# Patient Record
Sex: Male | Born: 2000 | Race: Black or African American | Hispanic: No | Marital: Single | Smoking: Never smoker
Health system: Southern US, Community
[De-identification: ages and names within clinical notes are randomized; demographics above are authoritative.]

## PROBLEM LIST (undated history)

## (undated) DIAGNOSIS — J45909 Unspecified asthma, uncomplicated: Secondary | ICD-10-CM

## (undated) HISTORY — PX: CLEFT PALATE REPAIR: SUR1165

---
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
## 2020-03-05 LAB — HM HEPATITIS C SCREENING LAB: HM Hepatitis Screen: NEGATIVE

## 2020-03-05 LAB — HIV ANTIBODY (ROUTINE TESTING W REFLEX): HIV 1&2 Ab, 4th Generation: NONREACTIVE

## 2020-06-13 ENCOUNTER — Encounter (HOSPITAL_COMMUNITY): Payer: Self-pay

## 2020-06-13 ENCOUNTER — Emergency Department (HOSPITAL_COMMUNITY): Payer: No Typology Code available for payment source

## 2020-06-13 ENCOUNTER — Emergency Department (HOSPITAL_COMMUNITY)
Admission: EM | Admit: 2020-06-13 | Discharge: 2020-06-13 | Disposition: A | Discharge status: Home / Self Care | Payer: No Typology Code available for payment source | Attending: Emergency Medicine | Admitting: Emergency Medicine

## 2020-06-13 ENCOUNTER — Other Ambulatory Visit: Payer: Self-pay

## 2020-06-13 DIAGNOSIS — Y92410 Unspecified street and highway as the place of occurrence of the external cause: Secondary | ICD-10-CM | POA: Insufficient documentation

## 2020-06-13 DIAGNOSIS — M25562 Pain in left knee: Secondary | ICD-10-CM | POA: Insufficient documentation

## 2020-06-13 DIAGNOSIS — R079 Chest pain, unspecified: Secondary | ICD-10-CM | POA: Insufficient documentation

## 2020-06-13 DIAGNOSIS — M25511 Pain in right shoulder: Secondary | ICD-10-CM | POA: Diagnosis not present

## 2020-06-13 DIAGNOSIS — M25521 Pain in right elbow: Secondary | ICD-10-CM | POA: Insufficient documentation

## 2020-06-13 MED ORDER — CYCLOBENZAPRINE HCL 10 MG PO TABS: 10.0000 mg | ORAL_TABLET | Freq: Two times a day (BID) | ORAL | 0 refills | Status: AC | PRN

## 2020-06-13 MED ORDER — CYCLOBENZAPRINE HCL 10 MG PO TABS
5.0000 mg | ORAL_TABLET | Freq: Once | ORAL | Status: AC
  Administered 2020-06-13: 5 mg via ORAL
  Filled 2020-06-13: qty 1

## 2020-06-13 MED ORDER — ACETAMINOPHEN 325 MG PO TABS
650.0000 mg | ORAL_TABLET | Freq: Once | ORAL | Status: AC
  Administered 2020-06-13: 650 mg via ORAL
  Filled 2020-06-13: qty 2
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
## 2020-07-28 DIAGNOSIS — Z91199 Patient's noncompliance with other medical treatment and regimen due to unspecified reason: Secondary | ICD-10-CM | POA: Insufficient documentation

## 2020-07-28 DIAGNOSIS — Z113 Encounter for screening for infections with a predominantly sexual mode of transmission: Secondary | ICD-10-CM | POA: Insufficient documentation

## 2020-07-28 DIAGNOSIS — R369 Urethral discharge, unspecified: Secondary | ICD-10-CM | POA: Insufficient documentation

## 2020-07-28 DIAGNOSIS — R300 Dysuria: Secondary | ICD-10-CM | POA: Insufficient documentation

## 2020-07-28 HISTORY — DX: Patient's noncompliance with other medical treatment and regimen due to unspecified reason: Z91.199

## 2021-10-05 ENCOUNTER — Emergency Department (HOSPITAL_COMMUNITY)
Admission: EM | Admit: 2021-10-05 | Discharge: 2021-10-05 | Disposition: A | Discharge status: Home / Self Care | Payer: Medicaid Other | Attending: Emergency Medicine | Admitting: Emergency Medicine

## 2021-10-05 ENCOUNTER — Emergency Department (HOSPITAL_COMMUNITY): Payer: Medicaid Other

## 2021-10-05 ENCOUNTER — Encounter (HOSPITAL_COMMUNITY): Payer: Self-pay

## 2021-10-05 DIAGNOSIS — S43402A Unspecified sprain of left shoulder joint, initial encounter: Secondary | ICD-10-CM | POA: Insufficient documentation

## 2021-10-05 DIAGNOSIS — S4992XA Unspecified injury of left shoulder and upper arm, initial encounter: Secondary | ICD-10-CM | POA: Diagnosis present

## 2021-10-05 DIAGNOSIS — W19XXXA Unspecified fall, initial encounter: Secondary | ICD-10-CM | POA: Diagnosis not present

## 2021-10-05 DIAGNOSIS — Y93K1 Activity, walking an animal: Secondary | ICD-10-CM | POA: Diagnosis not present

## 2021-10-05 HISTORY — DX: Unspecified asthma, uncomplicated: J45.909

## 2021-10-05 NOTE — ED Provider Notes (Signed)
[facility]-EMERGENCY DEPT Provider Note   CSN: [id number] Arrival date & time: 10/05/21  1905     History  Chief Complaint  Patient presents with   Shoulder Injury    Michael Villanueva is a 21 y.o. male.  Patient here with left shoulder pain after getting tangled up with his dog while running and landed on his left shoulder.  Denies any weakness or numbness.  Did not hit his head or lose consciousness.  He is not on blood thinners.  No significant medical history.  Movement makes it worse.  Rest makes it better.  He took ibuprofen at home as well that helped.  The history is provided by the patient.       Home Medications Prior to Admission medications   Medication Sig Start Date End Date Taking? Authorizing Provider  cyclobenzaprine (FLEXERIL) 10 MG tablet Take 1 tablet (10 mg total) by mouth 2 (two) times daily as needed for muscle spasms. 06/13/20   Shanon Ace, PA-C      Allergies    Patient has no known allergies.    Review of Systems   Review of Systems  Physical Exam Updated Vital Signs  ED Triage Vitals  Enc Vitals Group     BP 10/05/21 1927 133/75     Pulse Rate 10/05/21 1927 65     Resp 10/05/21 1927 16     Temp 10/05/21 1927 98.8 F (37.1 C)     Temp Source 10/05/21 1927 Oral     SpO2 10/05/21 1927 100 %     Weight 10/05/21 1928 212 lb (96.2 kg)     Height 10/05/21 1928 5\' 8"  (1.727 m)     Head Circumference --      Peak Flow --      Pain Score 10/05/21 2028 8     Pain Loc --      Pain Edu? --      Excl. in [place]? --     Physical Exam Constitutional:      General: He is not in acute distress.    Appearance: He is not ill-appearing.  Cardiovascular:     Rate and Rhythm: Normal rate.     Pulses: Normal pulses.     Heart sounds: Normal heart sounds.  Musculoskeletal:        General: Tenderness present. No swelling. Normal range of motion.     Comments: Tenderness in the left shoulder but there is no obvious  deformity, good range of motion but with discomfort  Skin:    General: Skin is warm.     Capillary Refill: Capillary refill takes less than 2 seconds.  Neurological:     General: No focal deficit present.     Mental Status: He is alert.     Sensory: No sensory deficit.     Motor: No weakness.     ED Results / Procedures / Treatments   Labs (all labs ordered are listed, but only abnormal results are displayed) Labs Reviewed - No data to display  EKG None  Radiology DG Shoulder Left  Result Date: 10/05/2021 CLINICAL DATA:  Trauma to the left shoulder. EXAM: LEFT SHOULDER - 2+ VIEW COMPARISON:  None Available. FINDINGS: There is no evidence of fracture or dislocation. There is no evidence of arthropathy or other focal bone abnormality. Soft tissues are unremarkable. IMPRESSION: Negative. Electronically Signed   By: 10/07/2021 M.D.   On: 10/05/2021 21:25    Procedures Procedures  Medications Ordered in ED Medications - No data to display  ED Course/ Medical Decision Making/ A&P                           Medical Decision Making  Michael Villanueva is here with left shoulder pain after fall.  Normal vitals.  No fever.  Neurovascular neuromuscular intact on exam.  He is tender over the left shoulder area.  Differential diagnosis is fracture versus less likely dislocation versus likely sprain.  X-ray was obtained per my review and interpretation shows no fracture or dislocation.  Overall suspect sprain.  Possibly contusion.  Recommend ice, Tylenol, ibuprofen.  Placed in a splint.  We will follow-up with orthopedics.  Discharged in good condition.  This chart was dictated using voice recognition software.  Despite best efforts to proofread,  errors can occur which can change the documentation meaning.         Final Clinical Impression(s) / ED Diagnoses Final diagnoses:  Sprain of left shoulder, unspecified shoulder sprain type, initial encounter    Rx / DC Orders ED  Discharge Orders     None         Virgina Norfolk, DO 10/05/21 2314

## 2021-10-05 NOTE — Discharge Instructions (Signed)
Recommend 1000 mg of Tylenol every 6 hours as needed for pain.  Recommend 800 mg ibuprofen every 8 hours as needed for pain.  Recommend ice.  Wear sling for comfort.  Follow-up with orthopedics.

## 2021-10-05 NOTE — ED Triage Notes (Signed)
Pt was running with his dog, got tangled, and fell onto left shoulder.Pt has 8/10 pain when trying to move left arm

## 2021-10-28 ENCOUNTER — Other Ambulatory Visit: Payer: Self-pay | Admitting: Orthopedic Surgery

## 2021-10-28 DIAGNOSIS — M25512 Pain in left shoulder: Secondary | ICD-10-CM | POA: Insufficient documentation

## 2021-10-29 ENCOUNTER — Other Ambulatory Visit: Payer: Self-pay | Admitting: Orthopedic Surgery

## 2021-10-29 ENCOUNTER — Ambulatory Visit
Admission: RE | Admit: 2021-10-29 | Discharge: 2021-10-29 | Disposition: A | Discharge status: Home / Self Care | Payer: Medicaid Other | Source: Ambulatory Visit | Attending: Orthopedic Surgery | Admitting: Orthopedic Surgery

## 2021-10-29 DIAGNOSIS — M25512 Pain in left shoulder: Secondary | ICD-10-CM

## 2022-05-20 ENCOUNTER — Ambulatory Visit
Admission: RE | Admit: 2022-05-20 | Discharge: 2022-05-20 | Disposition: A | Discharge status: Home / Self Care | Payer: Self-pay | Source: Ambulatory Visit | Attending: Internal Medicine | Admitting: Internal Medicine

## 2022-05-20 VITALS — BP 137/86 | HR 81 | Temp 97.8°F | Resp 18

## 2022-05-20 DIAGNOSIS — Z113 Encounter for screening for infections with a predominantly sexual mode of transmission: Secondary | ICD-10-CM

## 2022-05-20 DIAGNOSIS — R369 Urethral discharge, unspecified: Secondary | ICD-10-CM

## 2022-05-20 DIAGNOSIS — R300 Dysuria: Secondary | ICD-10-CM

## 2022-05-20 LAB — POCT URINALYSIS DIP (MANUAL ENTRY)
Bilirubin, UA: NEGATIVE
Blood, UA: NEGATIVE
Glucose, UA: NEGATIVE mg/dL
Ketones, POC UA: NEGATIVE mg/dL
Leukocytes, UA: NEGATIVE
Nitrite, UA: NEGATIVE
Protein Ur, POC: NEGATIVE mg/dL
Spec Grav, UA: >=1.03 — AB (ref 1.010–1.025)
Urobilinogen, UA: 1 E.U./dL
pH, UA: 7 (ref 5.0–8.0)

## 2022-05-20 NOTE — ED Provider Notes (Signed)
[facility]    CSN: [id number] Arrival date & time: 05/20/22  1117      History   Chief Complaint Chief Complaint  Patient presents with   Penile Discharge    And also my ears have had a lot of wax and I believe puss but I'm not sure - Entered by patient   Urinary Retention    HPI Michael Villanueva is a 22 y.o. male.   Patient presents with dysuria and yellow penile discharge that started about 2 days ago.  He denies any exposure to STD but does report that he had unprotected sexual intercourse with 1 male sexual partner about 3 days ago.  Denies testicular pain or swelling, abdominal pain, back pain, fever.   Penile Discharge    Past Medical History:  Diagnosis Date   Asthma     There are no problems to display for this patient.   Past Surgical History:  Procedure Laterality Date   CLEFT PALATE REPAIR         Home Medications    Prior to Admission medications   Medication Sig Start Date End Date Taking? Authorizing Provider  cyclobenzaprine (FLEXERIL) 10 MG tablet Take 1 tablet (10 mg total) by mouth 2 (two) times daily as needed for muscle spasms. 06/13/20   Barrie Folk, PA-C    Family History Family History  Family history unknown: Yes    Social History Social History   Tobacco Use   Smoking status: Never   Smokeless tobacco: Never     Allergies   Patient has no known allergies.   Review of Systems Review of Systems Per HPI  Physical Exam Triage Vital Signs ED Triage Vitals  Enc Vitals Group     BP 05/20/22 1132 137/86     Pulse Rate 05/20/22 1132 81     Resp 05/20/22 1132 18     Temp 05/20/22 1132 97.8 F (36.6 C)     Temp Source 05/20/22 1132 Oral     SpO2 05/20/22 1132 97 %     Weight --      Height --      Head Circumference --      Peak Flow --      Pain Score 05/20/22 1133 1     Pain Loc --      Pain Edu? --      Excl. in [place]? --    No data found.  Updated Vital Signs BP 137/86 (BP Location:  Left Arm)   Pulse 81   Temp 97.8 F (36.6 C) (Oral)   Resp 18   SpO2 97%   Visual Acuity Right Eye Distance:   Left Eye Distance:   Bilateral Distance:    Right Eye Near:   Left Eye Near:    Bilateral Near:     Physical Exam Constitutional:      General: He is not in acute distress.    Appearance: Normal appearance. He is not toxic-appearing or diaphoretic.  HENT:     Head: Normocephalic and atraumatic.  Eyes:     Extraocular Movements: Extraocular movements intact.     Conjunctiva/sclera: Conjunctivae normal.  Pulmonary:     Effort: Pulmonary effort is normal.  Genitourinary:    Comments: Deferred with shared decision making. Self swab performed.  Neurological:     General: No focal deficit present.     Mental Status: He is alert and oriented to person, place, and time. Mental status is at baseline.  Psychiatric:  Mood and Affect: Mood normal.        Behavior: Behavior normal.        Thought Content: Thought content normal.        Judgment: Judgment normal.      UC Treatments / Results  Labs (all labs ordered are listed, but only abnormal results are displayed) Labs Reviewed  POCT URINALYSIS DIP (MANUAL ENTRY) - Abnormal; Notable for the following components:      Result Value   Spec Grav, UA >=1.030 (*)    All other components within normal limits  CYTOLOGY, (ORAL, ANAL, URETHRAL) ANCILLARY ONLY    EKG   Radiology No results found.  Procedures Procedures (including critical care time)  Medications Ordered in UC Medications - No data to display  Initial Impression / Assessment and Plan / UC Course  I have reviewed the triage vital signs and the nursing notes.  Pertinent labs & imaging results that were available during my care of the patient were reviewed by me and considered in my medical decision making (see chart for details).     UA completed given dysuria which was unremarkable.  Cytology swab pending given concern for STD.  Awaiting  result for treatment given no confirmed exposure.  Advised to refrain from sexual activity until test results and treatment are complete.  Patient verbalized understanding and was agreeable with plan. Final Clinical Impressions(s) / UC Diagnoses   Final diagnoses:  Penile discharge  Dysuria  Screening examination for venereal disease     Discharge Instructions      Urine was clear.  STD test is pending.  Will call if it is abnormal.      ED Prescriptions   None    PDMP not reviewed this encounter.   Teodora Medici, [place] 05/20/22 [phone]

## 2022-05-20 NOTE — Discharge Instructions (Signed)
Urine was clear.  STD test is pending.  Will call if it is abnormal.

## 2022-05-20 NOTE — ED Triage Notes (Signed)
Pt presents with abnormal discharge and urinary urgency X 2 days.

## 2022-05-21 LAB — CYTOLOGY, (ORAL, ANAL, URETHRAL) ANCILLARY ONLY
Chlamydia: POSITIVE — AB
Comment: NEGATIVE
Comment: NEGATIVE
Comment: NORMAL
Neisseria Gonorrhea: NEGATIVE
Trichomonas: NEGATIVE

## 2022-05-24 ENCOUNTER — Telehealth (HOSPITAL_COMMUNITY): Payer: Self-pay | Admitting: Emergency Medicine

## 2022-05-24 MED ORDER — DOXYCYCLINE HYCLATE 100 MG PO CAPS: 100.0000 mg | ORAL_CAPSULE | Freq: Two times a day (BID) | ORAL | 0 refills | Status: AC

## 2022-06-16 ENCOUNTER — Ambulatory Visit
Admission: RE | Admit: 2022-06-16 | Discharge: 2022-06-16 | Disposition: A | Discharge status: Home / Self Care | Payer: Self-pay | Source: Ambulatory Visit | Attending: Emergency Medicine | Admitting: Emergency Medicine

## 2022-06-16 VITALS — BP 135/83 | HR 93 | Temp 98.6°F | Resp 16

## 2022-06-16 DIAGNOSIS — H60393 Other infective otitis externa, bilateral: Secondary | ICD-10-CM

## 2022-06-16 MED ORDER — CIPROFLOXACIN-DEXAMETHASONE 0.3-0.1 % OT SUSP: 4.0000 [drp] | Freq: Two times a day (BID) | OTIC | 0 refills | Status: DC

## 2022-06-16 NOTE — ED Provider Notes (Signed)
[facility]    CSN: [id number] Arrival date & time: 06/16/22  1351      History   Chief Complaint Chief Complaint  Patient presents with   Otalgia    HPI Michael Villanueva is a 22 y.o. male.   Patient on bilateral ear pain for 7 days.  Symptoms occurring intermittently.  Denies pruritus, decreased hearing or fullness, fevers or URI symptoms.  Has attempted use of over-the-counter eardrops and decongestants which have been ineffective.  Endorses recently being at a water park but  denies head getting wet.    Past Medical History:  Diagnosis Date   Asthma     There are no problems to display for this patient.   Past Surgical History:  Procedure Laterality Date   CLEFT PALATE REPAIR         Home Medications    Prior to Admission medications   Medication Sig Start Date End Date Taking? Authorizing Provider  cyclobenzaprine (FLEXERIL) 10 MG tablet Take 1 tablet (10 mg total) by mouth 2 (two) times daily as needed for muscle spasms. 06/13/20   Shanon Ace, PA-C    Family History Family History  Family history unknown: Yes    Social History Social History   Tobacco Use   Smoking status: Never   Smokeless tobacco: Never     Allergies   Patient has no known allergies.   Review of Systems Review of Systems  Constitutional: Negative.   HENT:  Positive for ear pain. Negative for congestion, dental problem, drooling, ear discharge, facial swelling, hearing loss, mouth sores, nosebleeds, postnasal drip, rhinorrhea, sinus pressure, sinus pain, sneezing, sore throat, tinnitus, trouble swallowing and voice change.   Respiratory: Negative.    Cardiovascular: Negative.      Physical Exam Triage Vital Signs ED Triage Vitals [06/16/22 1422]  Enc Vitals Group     BP 135/83     Pulse Rate 93     Resp 16     Temp 98.6 F (37 C)     Temp Source Oral     SpO2 97 %     Weight      Height      Head Circumference      Peak Flow      Pain  Score 6     Pain Loc      Pain Edu?      Excl. in GC?    No data found.  Updated Vital Signs BP 135/83 (BP Location: Left Arm)   Pulse 93   Temp 98.6 F (37 C) (Oral)   Resp 16   SpO2 97%   Visual Acuity Right Eye Distance:   Left Eye Distance:   Bilateral Distance:    Right Eye Near:   Left Eye Near:    Bilateral Near:     Physical Exam Constitutional:      Appearance: Normal appearance.  HENT:     Head:     Comments: Erythema is present to the bilateral ear canals without drainage or swelling, no abnormalities to the tympanic membranes or external ear Eyes:     Extraocular Movements: Extraocular movements intact.  Pulmonary:     Effort: Pulmonary effort is normal.  Neurological:     Mental Status: He is alert and oriented to person, place, and time. Mental status is at baseline.      UC Treatments / Results  Labs (all labs ordered are listed, but only abnormal results are displayed) Labs Reviewed - No  data to display  EKG   Radiology No results found.  Procedures Procedures (including critical care time)  Medications Ordered in UC Medications - No data to display  Initial Impression / Assessment and Plan / UC Course  I have reviewed the triage vital signs and the nursing notes.  Pertinent labs & imaging results that were available during my care of the patient were reviewed by me and considered in my medical decision making (see chart for details).  Infective otitis externa of both ears  Presentation is consistent with infection to the ear canal, discussed with patient, Ciprodex prescribed and discussed administration, may use over-the-counter analgesics and warm compresses for pain control, advised against ear cleaning Final Clinical Impressions(s) / UC Diagnoses   Final diagnoses:  None   Discharge Instructions   None    ED Prescriptions   None    PDMP not reviewed this encounter.   Valinda Hoar, NP 06/16/22 [phone]

## 2022-06-16 NOTE — Discharge Instructions (Addendum)
Today you are being treated for an infection of the ear canal   Place 4 drops of Ciprodex which is a mixture of antibiotic and steroid into both ears every morning and every evening for 7 days  You may use Tylenol or ibuprofen for management of discomfort  May hold warm compresses to the ear for additional comfort  Please not attempted any ear cleaning or object or fluid placement into the ear canal to prevent further irritation

## 2022-06-16 NOTE — ED Triage Notes (Signed)
Pt c/o bilat otalgia onset "sometime last week."

## 2022-06-17 ENCOUNTER — Ambulatory Visit: Payer: Self-pay

## 2022-08-26 ENCOUNTER — Ambulatory Visit: Payer: Medicaid Other | Admitting: Critical Care Medicine

## 2022-08-26 NOTE — Progress Notes (Deleted)
   New Patient Office Visit  Subjective    Patient ID: Michael Villanueva, male    DOB: [birth year]  Age: 22 y.o. MRN: [id number]  CC: No chief complaint on file.   HPI Michael Villanueva presents to establish care ***  Outpatient Encounter Medications as of 08/26/2022  Medication Sig   ciprofloxacin-dexamethasone (CIPRODEX) OTIC suspension Place 4 drops into both ears 2 (two) times daily.   cyclobenzaprine (FLEXERIL) 10 MG tablet Take 1 tablet (10 mg total) by mouth 2 (two) times daily as needed for muscle spasms.   No facility-administered encounter medications on file as of 08/26/2022.    Past Medical History:  Diagnosis Date   Asthma     Past Surgical History:  Procedure Laterality Date   CLEFT PALATE REPAIR      Family History  Family history unknown: Yes    Social History   Socioeconomic History   Marital status: Single    Spouse name: Not on file   Number of children: Not on file   Years of education: Not on file   Highest education level: Not on file  Occupational History   Not on file  Tobacco Use   Smoking status: Never   Smokeless tobacco: Never  Substance and Sexual Activity   Alcohol use: Not on file   Drug use: Not on file   Sexual activity: Not on file  Other Topics Concern   Not on file  Social History Narrative   Not on file   Social Determinants of Health   Financial Resource Strain: Not on file  Food Insecurity: Not on file  Transportation Needs: Not on file  Physical Activity: Not on file  Stress: Not on file  Social Connections: Not on file  Intimate Partner Violence: Not on file    ROS      Objective    There were no vitals taken for this visit.  Physical Exam  {Labs (Optional):23779}    Assessment & Plan:   Problem List Items Addressed This Visit   None   No follow-ups on file.   Shan Levans, MD
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
## 2023-03-24 ENCOUNTER — Ambulatory Visit
Admission: EM | Admit: 2023-03-24 | Discharge: 2023-03-24 | Disposition: A | Discharge status: Home / Self Care | Payer: Self-pay | Attending: Physician Assistant | Admitting: Physician Assistant

## 2023-03-24 ENCOUNTER — Ambulatory Visit: Payer: Self-pay

## 2023-03-24 ENCOUNTER — Emergency Department (HOSPITAL_BASED_OUTPATIENT_CLINIC_OR_DEPARTMENT_OTHER)
Admission: EM | Admit: 2023-03-24 | Discharge: 2023-03-24 | Disposition: A | Discharge status: Home / Self Care | Payer: Self-pay | Attending: Emergency Medicine | Admitting: Emergency Medicine

## 2023-03-24 ENCOUNTER — Encounter (HOSPITAL_BASED_OUTPATIENT_CLINIC_OR_DEPARTMENT_OTHER): Payer: Self-pay | Admitting: Radiology

## 2023-03-24 ENCOUNTER — Other Ambulatory Visit: Payer: Self-pay

## 2023-03-24 DIAGNOSIS — R8290 Unspecified abnormal findings in urine: Secondary | ICD-10-CM | POA: Insufficient documentation

## 2023-03-24 DIAGNOSIS — R300 Dysuria: Secondary | ICD-10-CM | POA: Insufficient documentation

## 2023-03-24 DIAGNOSIS — J029 Acute pharyngitis, unspecified: Secondary | ICD-10-CM | POA: Insufficient documentation

## 2023-03-24 DIAGNOSIS — Q549 Hypospadias, unspecified: Secondary | ICD-10-CM | POA: Insufficient documentation

## 2023-03-24 DIAGNOSIS — R899 Unspecified abnormal finding in specimens from other organs, systems and tissues: Secondary | ICD-10-CM | POA: Insufficient documentation

## 2023-03-24 LAB — BASIC METABOLIC PANEL
Anion gap: 7 (ref 5–15)
BUN: 11 mg/dL (ref 6–20)
CO2: 25 mmol/L (ref 22–32)
Calcium: 8.8 mg/dL — ABNORMAL LOW (ref 8.9–10.3)
Chloride: 104 mmol/L (ref 98–111)
Creatinine, Ser: 0.95 mg/dL (ref 0.61–1.24)
GFR, Estimated: >60 mL/min (ref >60)
Glucose, Bld: 104 mg/dL — ABNORMAL HIGH (ref 70–99)
Potassium: 4.1 mmol/L (ref 3.5–5.1)
Sodium: 136 mmol/L (ref 135–145)

## 2023-03-24 LAB — POCT URINALYSIS DIP (MANUAL ENTRY)
Blood, UA: NEGATIVE
Glucose, UA: NEGATIVE mg/dL
Ketones, POC UA: NEGATIVE mg/dL
Leukocytes, UA: NEGATIVE
Nitrite, UA: NEGATIVE
Protein Ur, POC: 100 mg/dL — AB
Spec Grav, UA: >=1.03 — AB (ref 1.010–1.025)
Urobilinogen, UA: >=8 U/dL — AB
pH, UA: 6.5 (ref 5.0–8.0)

## 2023-03-24 LAB — CBC WITH DIFFERENTIAL/PLATELET
Abs Immature Granulocytes: 0.07 10*3/uL (ref 0.00–0.07)
Basophils Absolute: 0 10*3/uL (ref 0.0–0.1)
Basophils Relative: 0 %
Eosinophils Absolute: 0.1 10*3/uL (ref 0.0–0.5)
Eosinophils Relative: 1 %
HCT: 46.4 % (ref 39.0–52.0)
Hemoglobin: 15.3 g/dL (ref 13.0–17.0)
Immature Granulocytes: 1 %
Lymphocytes Relative: 30 %
Lymphs Abs: 2.6 10*3/uL (ref 0.7–4.0)
MCH: 26.8 pg (ref 26.0–34.0)
MCHC: 33 g/dL (ref 30.0–36.0)
MCV: 81.3 fL (ref 80.0–100.0)
Monocytes Absolute: 1 10*3/uL (ref 0.1–1.0)
Monocytes Relative: 11 %
Neutro Abs: 5 10*3/uL (ref 1.7–7.7)
Neutrophils Relative %: 57 %
Platelets: 257 10*3/uL (ref 150–400)
RBC: 5.71 MIL/uL (ref 4.22–5.81)
RDW: 12.7 % (ref 11.5–15.5)
WBC: 8.7 10*3/uL (ref 4.0–10.5)
nRBC: 0 % (ref 0.0–0.2)

## 2023-03-24 LAB — HEPATIC FUNCTION PANEL
ALT: 119 U/L — ABNORMAL HIGH (ref 0–44)
AST: 86 U/L — ABNORMAL HIGH (ref 15–41)
Albumin: 3.3 g/dL — ABNORMAL LOW (ref 3.5–5.0)
Alkaline Phosphatase: 79 U/L (ref 38–126)
Bilirubin, Direct: 0.1 mg/dL (ref 0.0–0.2)
Indirect Bilirubin: 0.4 mg/dL (ref 0.3–0.9)
Total Bilirubin: 0.5 mg/dL (ref 0.0–1.2)
Total Protein: 6.6 g/dL (ref 6.5–8.1)

## 2023-03-24 LAB — URINALYSIS, ROUTINE W REFLEX MICROSCOPIC
Glucose, UA: NEGATIVE mg/dL
Hgb urine dipstick: NEGATIVE
Ketones, ur: NEGATIVE mg/dL
Leukocytes,Ua: NEGATIVE
Nitrite: NEGATIVE
Protein, ur: 30 mg/dL — AB
Specific Gravity, Urine: 1.025 (ref 1.005–1.030)
pH: 6.5 (ref 5.0–8.0)

## 2023-03-24 LAB — URINALYSIS, MICROSCOPIC (REFLEX)
RBC / HPF: NONE SEEN RBC/hpf (ref 0–5)
WBC, UA: NONE SEEN WBC/hpf (ref 0–5)

## 2023-03-24 LAB — LIPASE, BLOOD: Lipase: 22 U/L (ref 11–51)

## 2023-03-24 LAB — MONONUCLEOSIS SCREEN: Mono Screen: NEGATIVE

## 2023-03-24 LAB — POCT RAPID STREP A (OFFICE): Rapid Strep A Screen: NEGATIVE

## 2023-03-24 NOTE — ED Notes (Signed)
 Patient is being discharged from the Urgent Care and sent to the Emergency Department via Private Vehicle (Self) . Per R. Billy RIGGERS, patient is in need of higher level of care due to Abnormal Urine. Patient is aware and verbalizes understanding of plan of care.  Vitals:   03/24/23 1450  BP: 134/82  Pulse: 88  Resp: 18  Temp: 97.9 F (36.6 C)  SpO2: 98%

## 2023-03-24 NOTE — ED Triage Notes (Signed)
 Pt states he developed a sore throat yesterday. States it feels like when he had strep throat. Pt also endorsing painful urination at times. PT also endorsing penis pain on and off since he had chlamydia last April. Pt was seen at a urgent care today and urinalysis done and resulted. Pt also with cytology sent off, unsure for what. Pt also tested for strep and it was negative. Pt was sent here due to having bilirubin in his urine.

## 2023-03-24 NOTE — Discharge Instructions (Addendum)
 You were seen in the ER today for evaluation of your abnormal labs as well as your sore throat and pain to your penis.  Your lab work does show you have a slightly elevated liver enzymes.  I would like for you to follow-up with your primary care doctor for further evaluation of this.  You do have a small and bilirubin in your urine however your urine appears to be greatly improved from the urine seen in urgent care.  Again, because of your urinary lab results as well as your liver functions, you will need to follow-up with her primary care doctor for further evaluation of this.  Additionally, I would like for you to discuss about your pain with urination as well.  I culture your urine to see if it grows out any signs of infection.  For your sore throat, recommend throat lozenges and keeping your throat well-hydrated lubricated with things such as popsicles, cold water, etc.  Your strep test was negative.  You can try Tylenol  ibuprofen as needed for pain.  If you have any concerns, new or worsening symptoms, please return to the nearest emergency department for reevaluation.  Please follow-up with your MyChart for monotest results.  If you do test positive for mono, no contact sports for the next 4 to 6 weeks.  Again, you will need to follow-up department care doctor for this.  Contact a health care provider if: You have a fever or chills. You have pain in your back or sides. You throw up or feel like you may throw up. You have blood in your pee. You're not peeing as often as normal. You feel very weak. Get help right away if: You have very bad pain that doesn't get better with medicine. You're confused. You have a fast heartbeat while resting.

## 2023-03-24 NOTE — ED Provider Notes (Signed)
 [facility] Provider Note   CSN: [id number] Arrival date & time: 03/24/23  1711     History Chief Complaint  Patient presents with   Sore Throat    Michael Villanueva is a 23 y.o. male self-reported healthy presents to the emergency department today for evaluation of bilirubin in his urine.  Patient was seen in urgent care for sore throat and pain to the tip of his penis off and on since April.  When doing a urinalysis, there was significant amount of urine bilirubin, bilirubin, and protein in the urine and was sent over here for further evaluation.  He did have a strep test that was negative.  Has not had any fevers or trouble swallowing.  No runny nose or nasal congestion.  Denies any chest pain or trouble breathing.  He reports that he was recently treated for chlamydia in April and has been having pain in his penis intermittently since then.  Will occasionally have some dysuria but no hematuria.  Denies any urgency or frequency.  Denies any discharge from the penis.  He has not had any sexual contact since being treated for STDs in April.  He reports that he adhere to medication and took to completion.  He does not take any supplements.  Reports rare alcohol use.  Denies any other tobacco or illicit drug use.  He denies any pain into his testicles or scrotum.  Denies any swelling to the area as well.  No swelling or skin changes to the penis.  He reports that occasionally he will also have some right upper quadrant abdominal pain off and on since April.  Not having any now.  Cannot relax, he had this pain.  Does not endorse any nausea or vomiting.  No bowel changes.   Sore Throat Pertinent negatives include no chest pain and no shortness of breath.       Home Medications Prior to Admission medications   Medication Sig Start Date End Date Taking? Authorizing Provider  albuterol (VENTOLIN HFA) 108 (90 Base) MCG/ACT inhaler Inhale 2 puffs into the  lungs every 4 (four) hours as needed for wheezing or shortness of breath. 04/18/19   [provider]  cefTRIAXone  (ROCEPHIN ) 500 MG injection Inject 500 mg into the muscle once. 07/29/20   [provider]  ciprofloxacin -dexamethasone  (CIPRODEX ) OTIC suspension Place 4 drops into both ears 2 (two) times daily. 06/16/22   White, Shelba SAUNDERS, NP  cyclobenzaprine  (FLEXERIL ) 10 MG tablet Take 1 tablet (10 mg total) by mouth 2 (two) times daily as needed for muscle spasms. 06/13/20   Walisiewicz, Kaitlyn E, PA-C  dexmethylphenidate (FOCALIN XR) 20 MG 24 hr capsule Take 20 mg by mouth daily. 05/19/19   [provider]  fluticasone (FLONASE) 50 MCG/ACT nasal spray Place 2 sprays into both nostrils daily. 04/18/19   [provider]  montelukast (SINGULAIR) 10 MG tablet Take 1 tablet by mouth at bedtime. 04/18/19   [provider]      Allergies    Patient has no known allergies.    Review of Systems   Review of Systems  Constitutional:  Negative for chills and fever.  Respiratory:  Negative for shortness of breath.   Cardiovascular:  Negative for chest pain.  SEE HPI  Physical Exam Updated Vital Signs BP 129/87 (BP Location: Left Arm)   Pulse 94   Temp 98.6 F (37 C)   Resp 20   Ht 5' 7 (1.702 m)  Wt 100.7 kg   SpO2 100%   BMI 34.77 kg/m  Physical Exam Vitals and nursing note reviewed. Exam conducted with a chaperone present Denson, RN).  Constitutional:      General: He is not in acute distress.    Appearance: He is not ill-appearing or toxic-appearing.  HENT:     Mouth/Throat:     Mouth: Mucous membranes are moist.     Comments: Mild pharyngeal erythema.  No exudate or edema noted.  Airway patent.  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  Controlling secretions.  No sublingual elevation.  No trismus.  Normal speech. Cardiovascular:     Rate and Rhythm: Normal rate.  Pulmonary:     Effort: Pulmonary effort is  normal.     Breath sounds: Normal breath sounds.  Abdominal:     Palpations: Abdomen is soft.     Tenderness: There is no abdominal tenderness. There is no rebound.  Genitourinary:    Penis: Hypospadias present.      Testes:        Right: Mass, tenderness or swelling not present.        Left: Mass, tenderness or swelling not present.     Epididymis:     Right: Normal.     Left: Normal.     Comments: Hypospadias present.  Patient reports chronic for him.  There is no other abnormality seen to the penis.  No discharge.  No rash or lesions.  No increase in erythema or warmth.  Testicles have normal lie.  No masses palpated.  No edema. Skin:    General: Skin is warm and dry.  Neurological:     Mental Status: He is alert.     ED Results / Procedures / Treatments   Labs (all labs ordered are listed, but only abnormal results are displayed) Labs Reviewed  BASIC METABOLIC PANEL - Abnormal; Notable for the following components:      Result Value   Glucose, Bld 104 (*)    Calcium 8.8 (*)    All other components within normal limits  CBC WITH DIFFERENTIAL/PLATELET  HEPATIC FUNCTION PANEL  LIPASE, BLOOD  URINALYSIS, ROUTINE W REFLEX MICROSCOPIC    EKG None  Radiology No results found.  Procedures Procedures   Medications Ordered in ED Medications - No data to display  ED Course/ Medical Decision Making/ A&P   Medical Decision Making Amount and/or Complexity of Data Reviewed Labs: ordered.   23 y.o. male presents to the ER for evaluation of abnormal labs. Differential diagnosis includes but is not limited to lab error, normal labs, mono, strep throat, viral pharyngitis. Vital signs unremarkable. Physical exam as noted above.   On previous chart evaluation, patient was in urgent care earlier today.  Had a strep test that was negative.  Urinalysis showed straw-colored urine that was cloudy.  There is a small amount of bilirubin present with concentrated specific gravity.   There was 100 protein present as well as greater than 8.0 urobilinogen present.  Because of his abnormal urine, he was sent over to the emergency department.  I independently reviewed and interpreted the patient's labs.  CBC without leukocytosis or anemia.  Lipase within normal limits.  BMP shows glucose at 104 and mildly decreased calcium 8.8 otherwise no other electrolyte abnormality.  Hepatic function panel shows mildly decreased albumin at 3.3.  Mildly elevated AST and ALT at 86 and 119 respectively.  Normal alk phos.  Normal total bilirubin.  Urinalysis shows small  amount of bilirubin with 30 protein present.  There is rare bacteria but no red blood cells, white blood cells, nitrites, leukocytes present.  No ketones.  No hemoglobin.  I have added on the monotest given the patient's sore throat as well as mildly elevated liver enzymes.  Patient did have negative strep test at urgent care that is present under labs.  He does not have any belly pain tenderness on palpation.  Soft.  He denies any recent fevers or sick contacts.  We have decided to retest urine think there was not any lab error.  Monotest is negative.  I discussed with my attending.  Recommends following with her primary care provider for this.  Does not see any additional need for imaging or consultation.  I discussed this with the patient about the need to follow-up with her primary care provider for abnormal urine findings to see if this clears.  Also needs to check and further evaluate the elevated liver enzymes.  He stable for discharge home with close outpatient follow-up and strict return precautions.  We discussed the results of the labs/imaging. The plan is follow-up with primary care provider. We discussed strict return precautions and red flag symptoms. The patient verbalized their understanding and agrees to the plan. The patient is stable and being discharged home in good condition.  Portions of this report may have been  transcribed using voice recognition software. Every effort was made to ensure accuracy; however, inadvertent computerized transcription errors may be present.   I discussed this case with my attending physician who cosigned this note including patient's presenting symptoms, physical exam, and planned diagnostics and interventions. Attending physician stated agreement with plan or made changes to plan which were implemented.   Final Clinical Impression(s) / ED Diagnoses Final diagnoses:  Abnormal laboratory test result  Dysuria  Sore throat    Rx / DC Orders ED Discharge Orders     None         Bernis Ernst, DEVONNA 03/26/23 2017    Darra Fonda MATSU, MD 03/30/23 1409

## 2023-03-24 NOTE — ED Provider Notes (Signed)
 Patient presented today with multiple complaints one of which was painful urination. On review of UA it appears patient has marked urobilinogen, protein and bilirubin. Recommended further evaluation in the ED for stat labs, further work up. Patient is agreeable and will transport via POV.    Billy Asberry FALCON, PA-C 03/24/23 [phone]

## 2023-03-24 NOTE — ED Triage Notes (Signed)
"  I am having a sore throat and I have been having discomfort since April after having STI, this shock of discomfort is continuing on/off". Some "painful urination a lot". No current PCP. No rash in private area.

## 2023-03-24 NOTE — ED Notes (Signed)
 Provider unable to access a computer creating unreasonable inconvenience to the ordering provider with possible delay in patient care. Acknowledged by [employer]. Repeated Verbal order by Brian-Provider.

## 2023-03-25 ENCOUNTER — Telehealth: Payer: Self-pay | Admitting: *Deleted

## 2023-03-25 LAB — URINE CULTURE: Culture: NO GROWTH

## 2023-03-25 LAB — CYTOLOGY, (ORAL, ANAL, URETHRAL) ANCILLARY ONLY
Chlamydia: NEGATIVE
Chlamydia: NEGATIVE
Comment: NEGATIVE
Comment: NEGATIVE
Comment: NEGATIVE
Comment: NORMAL
Comment: NORMAL
Neisseria Gonorrhea: NEGATIVE
Neisseria Gonorrhea: NEGATIVE
Trichomonas: NEGATIVE

## 2023-03-25 LAB — GC/CHLAMYDIA PROBE AMP (~~LOC~~) NOT AT ARMC
Chlamydia: NEGATIVE
Comment: NEGATIVE
Comment: NORMAL
Neisseria Gonorrhea: NEGATIVE

## 2023-03-25 NOTE — Telephone Encounter (Signed)
 Call received from Cytology lab stating that trichomonas unable to be run off the throat and penile swabs received yesterday.

## 2023-03-29 ENCOUNTER — Ambulatory Visit (INDEPENDENT_AMBULATORY_CARE_PROVIDER_SITE_OTHER): Payer: Self-pay | Admitting: Family Medicine

## 2023-03-29 DIAGNOSIS — Z91199 Patient's noncompliance with other medical treatment and regimen due to unspecified reason: Secondary | ICD-10-CM

## 2023-03-30 NOTE — Progress Notes (Signed)
No show

## 2023-06-28 ENCOUNTER — Encounter: Payer: Self-pay | Admitting: Emergency Medicine

## 2023-06-28 ENCOUNTER — Ambulatory Visit
Admission: RE | Admit: 2023-06-28 | Discharge: 2023-06-28 | Disposition: A | Discharge status: Home / Self Care | Payer: Self-pay | Source: Ambulatory Visit

## 2023-06-28 ENCOUNTER — Ambulatory Visit
Admission: RE | Admit: 2023-06-28 | Discharge: 2023-06-28 | Disposition: A | Discharge status: Home / Self Care | Payer: Self-pay | Source: Ambulatory Visit | Attending: Family Medicine | Admitting: Family Medicine

## 2023-06-28 DIAGNOSIS — R369 Urethral discharge, unspecified: Secondary | ICD-10-CM

## 2023-06-28 DIAGNOSIS — Z113 Encounter for screening for infections with a predominantly sexual mode of transmission: Secondary | ICD-10-CM

## 2023-06-28 MED ORDER — CEFTRIAXONE SODIUM 500 MG IJ SOLR
500.0000 mg | INTRAMUSCULAR | Status: DC
  Administered 2023-06-28: 500 mg via INTRAMUSCULAR

## 2023-06-28 NOTE — ED Triage Notes (Signed)
 Pt c/o penis discharge and burning when he urinates since yesterday.

## 2023-06-28 NOTE — Discharge Instructions (Signed)
 You were seen today for sore throat and penile discharge.   I have given you a shot to treat possible gonorrhea.  Your swabs and blood work will be resulted in the next several days, and you will be called if anything is positive for further treatment.  Do not have sex until all results are completed and treatment is rendered if needed.

## 2023-06-28 NOTE — ED Provider Notes (Signed)
 [facility]    CSN: [id number] Arrival date & time: 06/28/23  1047      History   Chief Complaint Chief Complaint  Patient presents with   Exposure to STD    HPI Michael Villanueva is a 23 y.o. male.    Exposure to STD   Patient is here for penile burning and discharge since yesterday.  No known exposures to STDs.  Yellow d/c.   He is also having throat irritation that started yesterday.  He did partake in oral sex, and worried about that as well.  He states this is the same sensation as to when he tested positive for gonorrhea and would like to be treated if possible.  He would like blood work also     Past Medical History:  Diagnosis Date   Asthma     Patient Active Problem List   Diagnosis Date Noted   Pain of left sternoclavicular joint 10/29/2021   Shoulder pain, left 10/28/2021   Dysuria 07/28/2020   Penile discharge 07/28/2020   No-show for appointment 07/28/2020    Past Surgical History:  Procedure Laterality Date   CLEFT PALATE REPAIR         Home Medications    Prior to Admission medications   Medication Sig Start Date End Date Taking? Authorizing Provider  albuterol (VENTOLIN HFA) 108 (90 Base) MCG/ACT inhaler Inhale 2 puffs into the lungs every 4 (four) hours as needed for wheezing or shortness of breath. 04/18/19   [provider]  cefTRIAXone (ROCEPHIN) 500 MG injection Inject 500 mg into the muscle once. 07/29/20   [provider]  ciprofloxacin -dexamethasone  (CIPRODEX ) OTIC suspension Place 4 drops into both ears 2 (two) times daily. 06/16/22   White, Maybelle Spatz, NP  cyclobenzaprine  (FLEXERIL ) 10 MG tablet Take 1 tablet (10 mg total) by mouth 2 (two) times daily as needed for muscle spasms. 06/13/20   Walisiewicz, Kaitlyn E, PA-C  dexmethylphenidate (FOCALIN XR) 20 MG 24 hr capsule Take 20 mg by mouth daily. 05/19/19   [provider]  fluticasone (FLONASE) 50 MCG/ACT nasal spray Place 2 sprays into both  nostrils daily. 04/18/19   [provider]  montelukast (SINGULAIR) 10 MG tablet Take 1 tablet by mouth at bedtime. 04/18/19   [provider]    Family History Family History  Family history unknown: Yes    Social History Social History   Tobacco Use   Smoking status: Never   Smokeless tobacco: Never  Vaping Use   Vaping status: Never Used  Substance Use Topics   Alcohol use: Yes    Comment: Occassionally.   Drug use: Not Currently     Allergies   Patient has no known allergies.   Review of Systems Review of Systems  Constitutional: Negative.   HENT:  Positive for sore throat.   Respiratory: Negative.    Cardiovascular: Negative.   Gastrointestinal: Negative.   Genitourinary:  Positive for penile discharge.     Physical Exam Triage Vital Signs ED Triage Vitals  Encounter Vitals Group     BP 06/28/23 1110 (!) 143/85     Systolic BP Percentile --      Diastolic BP Percentile --      Pulse Rate 06/28/23 1110 99     Resp 06/28/23 1110 16     Temp 06/28/23 1110 98.9 F (37.2 C)     Temp Source 06/28/23 1110 Oral     SpO2 06/28/23 1110 97 %     Weight  06/28/23 1109 222 lb 0.1 oz (100.7 kg)     Height --      Head Circumference --      Peak Flow --      Pain Score 06/28/23 1109 10     Pain Loc --      Pain Education --      Exclude from Growth Chart --    No data found.  Updated Vital Signs BP (!) 143/85 (BP Location: Left Arm)   Pulse 99   Temp 98.9 F (37.2 C) (Oral)   Resp 16   Wt 100.7 kg   SpO2 97%   BMI 34.77 kg/m   Visual Acuity Right Eye Distance:   Left Eye Distance:   Bilateral Distance:    Right Eye Near:   Left Eye Near:    Bilateral Near:     Physical Exam Constitutional:      Appearance: Normal appearance. He is normal weight.  HENT:     Mouth/Throat:     Mouth: Mucous membranes are moist.     Pharynx: No oropharyngeal exudate or posterior oropharyngeal erythema.  Cardiovascular:     Rate and Rhythm:  Normal rate and regular rhythm.  Pulmonary:     Effort: Pulmonary effort is normal.     Breath sounds: Normal breath sounds.  Neurological:     General: No focal deficit present.     Mental Status: He is alert.  Psychiatric:        Mood and Affect: Mood normal.      UC Treatments / Results  Labs (all labs ordered are listed, but only abnormal results are displayed) Labs Reviewed  RPR  HIV ANTIBODY (ROUTINE TESTING W REFLEX)  CYTOLOGY, (ORAL, ANAL, URETHRAL) ANCILLARY ONLY  CYTOLOGY, (ORAL, ANAL, URETHRAL) ANCILLARY ONLY    EKG   Radiology No results found.  Procedures Procedures (including critical care time)  Medications Ordered in UC Medications  cefTRIAXone (ROCEPHIN) injection 500 mg (has no administration in time range)    Initial Impression / Assessment and Plan / UC Course  I have reviewed the triage vital signs and the nursing notes.  Pertinent labs & imaging results that were available during my care of the patient were reviewed by me and considered in my medical decision making (see chart for details).   Final Clinical Impressions(s) / UC Diagnoses   Final diagnoses:  Screening for STD (sexually transmitted disease)  Penile discharge     Discharge Instructions      You were seen today for sore throat and penile discharge.   I have given you a shot to treat possible gonorrhea.  Your swabs and blood work will be resulted in the next several days, and you will be called if anything is positive for further treatment.  Do not have sex until all results are completed and treatment is rendered if needed.   ED Prescriptions   None    PDMP not reviewed this encounter.   Lesle Ras, MD 06/28/23 1128

## 2023-06-29 ENCOUNTER — Telehealth: Payer: Self-pay

## 2023-06-29 LAB — CYTOLOGY, (ORAL, ANAL, URETHRAL) ANCILLARY ONLY
Chlamydia: NEGATIVE
Comment: NEGATIVE
Comment: NORMAL
Neisseria Gonorrhea: NEGATIVE

## 2023-06-29 NOTE — Telephone Encounter (Signed)
 Copied from CRM [id number]. Topic: MyChart - Other >> Jun 28, 2023  3:32 PM Stanly Early wrote: Reason for CRM: patient is requesting a doctors note.

## 2023-06-30 LAB — CYTOLOGY, (ORAL, ANAL, URETHRAL) ANCILLARY ONLY
Chlamydia: NEGATIVE
Comment: NEGATIVE
Comment: NORMAL
Neisseria Gonorrhea: POSITIVE — AB

## 2023-07-01 ENCOUNTER — Ambulatory Visit (HOSPITAL_COMMUNITY): Payer: Self-pay

## 2023-07-01 LAB — RPR: RPR Ser Ql: NONREACTIVE

## 2023-07-01 LAB — HIV ANTIBODY (ROUTINE TESTING W REFLEX): HIV Screen 4th Generation wRfx: NONREACTIVE

## 2023-10-10 ENCOUNTER — Ambulatory Visit: Payer: Self-pay

## 2023-10-10 DIAGNOSIS — Z113 Encounter for screening for infections with a predominantly sexual mode of transmission: Secondary | ICD-10-CM

## 2023-10-10 DIAGNOSIS — N341 Nonspecific urethritis: Secondary | ICD-10-CM

## 2023-10-10 LAB — GRAM STAIN

## 2023-10-10 LAB — HM HIV SCREENING LAB: HM HIV Screening: NEGATIVE

## 2023-10-10 LAB — HM HEPATITIS C SCREENING LAB: HM Hepatitis Screen: NEGATIVE

## 2023-10-10 MED ORDER — DOXYCYCLINE HYCLATE 100 MG PO TABS: 100.0000 mg | ORAL_TABLET | Freq: Two times a day (BID) | ORAL | Status: AC

## 2023-10-10 NOTE — Progress Notes (Signed)
 Pt here for STI screening.  Gram stain results reviewed with patient.  Results positive for NGU.  Doxycycline  100mg . #14 dispensed to patient.  Pt counseled re medication, side effects, plan of care and when to contact clinic with questions or concerns.  Verbalizes understanding.  Contact card x1. Condoms declined.-Javyon Fontan, RN

## 2023-10-10 NOTE — Progress Notes (Signed)
 [facility] STI clinic [address], [address] [place] [address] Main phone: [phone]  STI screening visit  Subjective:  Michael Villanueva is a 23 y.o. male being seen today for an STI screening visit. The patient reports they do not have symptoms.    Patient has the following medical conditions:  Patient Active Problem List   Diagnosis Date Noted   Pain of left sternoclavicular joint 10/29/2021   Shoulder pain, left 10/28/2021   Dysuria 07/28/2020   Penile discharge 07/28/2020   No-show for appointment 07/28/2020   Chief Complaint  Patient presents with   SEXUALLY TRANSMITTED DISEASE   HPI Patient reports penile discharge for 1 week. Discharge is clear/yellow. Some genital itching. No dysuria. One sexual partner in last 2 months, TTC w/ partner. No fevers, rashes, lesions. Hx of gonorrhea 3 months ago, treated. Also had chlamydia in the past.  See flowsheet for further details and programmatic requirements  Hyperlink available at the top of the signed note in blue.  Flow sheet content below:  Pregnancy Intention Screening Does the patient want to become pregnant in the next year?: Yes Does the patient's partner want to become pregnant in the next year?: Yes Would the patient like to discuss contraceptive options today?: N/A Reason For STD Screen STD Screening: Has symptoms Have you ever had an STD?: Yes History of Antibiotic use in the past 2 weeks?: No STD Symptoms Denies all: No Genital Itching: Yes Lower abdominal pain: No Discharge: Yes Dysuria: No Genital ulcer / lesion: No Rash: No Oral / Other skin ulcer: No Pain with sex: No Sore Throat: No Visual Changes: No Risk Factors for Hep B Household, sexual, or needle sharing contact of a person infected with Hep B: No Sexual contact with a person who uses drugs not as prescribed?: No Currently or Ever used drugs not as prescribed: No HIV Positive: No PRep Patient:  No Men who have sex with men: Yes Have Hepatitis C: No History of Incarceration: No History of Homeslessness?: Yes Anal sex following anal drug use?: No Risk Factors for Hep C Currently using drugs not as prescribed: No Sexual partner(s) currently using drugs as not prescribed: No History of drug use: No HIV Positive: No People with a history of incarceration: No People born between the years of 52 and 89: No Hepatitis Counseling Hep B Counseling: Counseled patient about increased risk of Hep B and recommendation for testing, Patient accepts testing for Hep B today Abuse History Has patient ever been abused physically?: No Has patient ever been abused sexually?: No Does patient feel they have a problem with Anxiety?: No Does patient feel they have a problem with Depression?: No Counseling Patient counseled to use condoms with all sex: Condoms declined RTC in 2-3 weeks for test results: Yes Clinic will call if test results abnormal before test result appt.: Yes Test results given to patient Patient counseled to use condoms with all sex: Condoms declined  Screening for MPX risk: Does the patient have an unexplained rash? No Is the patient MSM? Yes Does the patient endorse multiple sex partners or anonymous sex partners? No Did the patient have close or sexual contact with a person diagnosed with MPX? No Has the patient traveled outside the [place]  where MPX is endemic? No Is there a high clinical suspicion for MPX-- evidenced by one of the following No  -Unlikely to be chickenpox  -Lymphadenopathy  -Rash that present in same phase of evolution on any given  body part  STI screening history: Last HIV test per patient/review of record was No results found for: HMHIVSCREEN  Lab Results  Component Value Date   HIV Non Reactive 06/28/2023    Last HEPC test per patient/review of record was  Lab Results  Component Value Date   HMHEPCSCREEN Negative-Validated 03/05/2020   No  components found for: HEPC   Last HEPB test per patient/review of record was No components found for: HMHEPBSCREEN   Fertility: Does the patient or their partner desires a pregnancy in the next year? Yes   There is no immunization history on file for this patient.  The following portions of the patient's history were reviewed and updated as appropriate: allergies, current medications, past medical history, past social history, past surgical history and problem list.  Objective:  There were no vitals filed for this visit.  Physical Exam Exam conducted with a chaperone present Michael Villanueva).  Constitutional:      Appearance: Normal appearance.  HENT:     Head: Normocephalic and atraumatic.     Comments: No nits or hair loss    Mouth/Throat:     Lips: Pink.     Mouth: Mucous membranes are moist. No oral lesions.     Pharynx: Oropharynx is clear. Posterior oropharyngeal erythema present. No oropharyngeal exudate.     Comments: Scarring of upper lip Eyes:     General:        Right eye: No discharge.        Left eye: No discharge.     Conjunctiva/sclera:     Right eye: Right conjunctiva is not injected. No exudate.    Left eye: Left conjunctiva is not injected. No exudate. Pulmonary:     Effort: Pulmonary effort is normal.  Chest:     Comments: Protruding collarbone on left side from historical injury Abdominal:     General: Abdomen is flat.     Palpations: Abdomen is soft.  Genitourinary:    Pubic Area: No rash or pubic lice (no nits).      Penis: Circumcised. Hypospadias and discharge present. No tenderness, swelling or lesions.      Testes: Normal.     Epididymis:     Right: Normal. No mass or tenderness.     Left: Normal. No mass or tenderness.     Rectum: Normal. No tenderness (no lesions or discharge).     Comments: Penile Discharge Amount: moderate Color:  clear Lymphadenopathy:     Head:     Right side of head: No preauricular or posterior auricular  adenopathy.     Left side of head: No preauricular or posterior auricular adenopathy.     Cervical: No cervical adenopathy.     Upper Body:     Right upper body: No supraclavicular, axillary or epitrochlear adenopathy.     Left upper body: No supraclavicular, axillary or epitrochlear adenopathy.     Lower Body: No right inguinal adenopathy. No left inguinal adenopathy.  Skin:    General: Skin is warm and dry.     Findings: No lesion or rash.  Neurological:     Mental Status: He is alert and oriented to person, place, and time.    Assessment and Plan:  Michael Villanueva is a 23 y.o. male presenting to the [facility] Department for STI screening  1. Screening for venereal disease (Primary)  - Syphilis Serology, [facility] - HIV/HCV [facility] - HBV Antigen/Antibody [facility] - Chlamydia/GC NAA, Confirmation -  Gram stain - Gonococcus culture  2. NGU (nongonococcal urethritis)  - doxycycline  (VIBRA -TABS) 100 MG tablet; Take 1 tablet (100 mg total) by mouth 2 (two) times daily for 7 days.    Patient does have STI symptoms Patient accepted the following screenings: penile gram stain for GC, urine CT/GC, HIV, RPR, Hep B, and Hep C, gonococcus throat Patient meets criteria for HepB screening? Yes. Ordered? yes Patient meets criteria for HepC screening? Yes. Ordered? yes Recommended condom use with all sex Discussed importance of condom use for STI prevention  Treat positive test results per standing order. Discussed time line for [facility] results and that patient will be called with positive results and encouraged patient to call if he had not heard in 2 weeks Recommended repeat testing in 3 months with positive results. Recommended returning for continued or worsening symptoms.   Return in about 3 months (around 01/10/2024).  No future appointments.  Damien FORBES Satchel, NP

## 2023-10-11 LAB — HBV ANTIGEN/ANTIBODY STATE LAB
Hep B Core Total Ab: NONREACTIVE
Hep B S Ab: NONREACTIVE
Hepatitis B Surface Antigen: NONREACTIVE

## 2023-10-14 LAB — CHLAMYDIA/GC NAA, CONFIRMATION
Chlamydia trachomatis, NAA: NEGATIVE
Neisseria gonorrhoeae, NAA: NEGATIVE

## 2023-10-15 LAB — GONOCOCCUS CULTURE

## 2024-02-21 ENCOUNTER — Ambulatory Visit
Admission: RE | Admit: 2024-02-21 | Discharge: 2024-02-21 | Disposition: A | Discharge status: Home / Self Care | Payer: Self-pay | Attending: Nurse Practitioner | Admitting: Nurse Practitioner

## 2024-02-21 VITALS — BP 107/77 | HR 78 | Temp 97.8°F | Resp 20

## 2024-02-21 DIAGNOSIS — R369 Urethral discharge, unspecified: Secondary | ICD-10-CM | POA: Insufficient documentation

## 2024-02-21 MED ORDER — CEFTRIAXONE SODIUM 500 MG IJ SOLR
500.0000 mg | Freq: Once | INTRAMUSCULAR | Status: AC
  Administered 2024-02-21: 500 mg via INTRAMUSCULAR

## 2024-02-21 NOTE — ED Triage Notes (Signed)
 Pt reports he has penile discharge x 2 weeks   Reports unprotected encounter recently

## 2024-02-21 NOTE — Discharge Instructions (Addendum)
 We are testing you today for gonorrhea, chlamydia, trichomonas, HIV, and syphilis.  We will contact you if any testing comes back positive.  We are treating you for gonorrhea today since you are having symptoms.  Please refrain from sexual intercourse until you are aware of your test results.  If symptoms persist despite treatment, follow up with Urology.

## 2024-02-21 NOTE — ED Provider Notes (Signed)
 " [facility]    CSN: [id number] Arrival date & time: 02/21/24  1458      History   Chief Complaint Chief Complaint  Patient presents with   Follow-up    STD screening And I need to get my physical form filled out - Entered by patient    HPI Michael Villanueva is a 24 y.o. male.   Patient presents today for STI testing.  Reports 2 week history of penile discharge that is clear but dries white.  Reports intermittent burning with urination.      Past Medical History:  Diagnosis Date   Asthma    No-show for appointment 07/28/2020    Patient Active Problem List   Diagnosis Date Noted   Pain of left sternoclavicular joint 10/29/2021   Shoulder pain, left 10/28/2021   Dysuria 07/28/2020   Penile discharge 07/28/2020    Past Surgical History:  Procedure Laterality Date   CLEFT PALATE REPAIR         Home Medications    Prior to Admission medications  Medication Sig Start Date End Date Taking? Authorizing Provider  albuterol (VENTOLIN HFA) 108 (90 Base) MCG/ACT inhaler Inhale 2 puffs into the lungs every 4 (four) hours as needed for wheezing or shortness of breath. 04/18/19   [provider]  cyclobenzaprine  (FLEXERIL ) 10 MG tablet Take 1 tablet (10 mg total) by mouth 2 (two) times daily as needed for muscle spasms. 06/13/20   Walisiewicz, Kaitlyn E, PA-C  dexmethylphenidate (FOCALIN XR) 20 MG 24 hr capsule Take 20 mg by mouth daily. 05/19/19   [provider]  fluticasone (FLONASE) 50 MCG/ACT nasal spray Place 2 sprays into both nostrils daily. 04/18/19   [provider]  montelukast (SINGULAIR) 10 MG tablet Take 1 tablet by mouth at bedtime. 04/18/19   [provider]    Family History Family History  Family history unknown: Yes    Social History Social History[1]   Allergies   Patient has no known allergies.   Review of Systems Review of Systems Per HPI  Physical Exam Triage Vital Signs ED Triage Vitals  [02/21/24 1555]  Encounter Vitals Group     BP 107/77     Girls Systolic BP Percentile      Girls Diastolic BP Percentile      Boys Systolic BP Percentile      Boys Diastolic BP Percentile      Pulse Rate 78     Resp 20     Temp 97.8 F (36.6 C)     Temp Source Oral     SpO2 95 %     Weight      Height      Head Circumference      Peak Flow      Pain Score 0     Pain Loc      Pain Education      Exclude from Growth Chart    No data found.  Updated Vital Signs BP 107/77 (BP Location: Right Arm)   Pulse 78   Temp 97.8 F (36.6 C) (Oral)   Resp 20   SpO2 95%   Visual Acuity Right Eye Distance:   Left Eye Distance:   Bilateral Distance:    Right Eye Near:   Left Eye Near:    Bilateral Near:     Physical Exam Vitals and nursing note reviewed.  Constitutional:      General: He is not in acute distress.    Appearance:  Normal appearance. He is not toxic-appearing.  Pulmonary:     Effort: Pulmonary effort is normal. No respiratory distress.  Genitourinary:    Comments: Deferred-self swab performed by patient Skin:    General: Skin is warm and dry.     Capillary Refill: Capillary refill takes less than 2 seconds.     Coloration: Skin is not jaundiced or pale.  Neurological:     Mental Status: He is alert and oriented to person, place, and time.     Motor: No weakness.     Gait: Gait normal.  Psychiatric:        Behavior: Behavior is cooperative.      UC Treatments / Results  Labs (all labs ordered are listed, but only abnormal results are displayed) Labs Reviewed  HIV ANTIBODY (ROUTINE TESTING W REFLEX)  SYPHILIS: RPR W/REFLEX TO RPR TITER AND TREPONEMAL ANTIBODIES, TRADITIONAL SCREENING AND DIAGNOSIS ALGORITHM  CYTOLOGY, (ORAL, ANAL, URETHRAL) ANCILLARY ONLY    EKG   Radiology No results found.  Procedures Procedures (including critical care time)  Medications Ordered in UC Medications  cefTRIAXone  (ROCEPHIN ) injection 500 mg (500 mg  Intramuscular Given 02/21/24 1628)    Initial Impression / Assessment and Plan / UC Course  I have reviewed the triage vital signs and the nursing notes.  Pertinent labs & imaging results that were available during my care of the patient were reviewed by me and considered in my medical decision making (see chart for details).   Patient is a pleasant, well-appearing 25 year old male presenting today for urethritis.  Vital signs are stable in triage.  Given penile discharge, treat for gonorrhea with ceftriaxone  500 mg IM in urgent care today.  Cytology is pending-treat as indicated if anything other than gonorrhea.  HIV and syphilis testing also obtained today.  Recommended condom use with every sexual encounter.  If symptoms persist despite treatment and/or testing is negative, recommended follow-up with urology.  ER and return precautions discussed.  The patient was given the opportunity to ask questions.  All questions answered to their satisfaction.  The patient is in agreement to this plan.   Final Clinical Impressions(s) / UC Diagnoses   Final diagnoses:  Penile discharge     Discharge Instructions      We are testing you today for gonorrhea, chlamydia, trichomonas, HIV, and syphilis.  We will contact you if any testing comes back positive.  We are treating you for gonorrhea today since you are having symptoms.  Please refrain from sexual intercourse until you are aware of your test results.  If symptoms persist despite treatment, follow up with Urology.   ED Prescriptions   None    PDMP not reviewed this encounter.     [1]  Social History Tobacco Use   Smoking status: Never   Smokeless tobacco: Never  Vaping Use   Vaping status: Never Used  Substance Use Topics   Alcohol use: Yes    Comment: Occassionally.   Drug use: Not Currently     Chandra Harlene LABOR, NP 02/22/24 9088  "

## 2024-02-22 LAB — CYTOLOGY, (ORAL, ANAL, URETHRAL) ANCILLARY ONLY
Chlamydia: POSITIVE — AB
Comment: NEGATIVE
Comment: NEGATIVE
Comment: NORMAL
Neisseria Gonorrhea: NEGATIVE
Trichomonas: NEGATIVE

## 2024-02-23 ENCOUNTER — Ambulatory Visit: Payer: Self-pay | Admitting: Nurse Practitioner

## 2024-02-23 LAB — SYPHILIS: RPR W/REFLEX TO RPR TITER AND TREPONEMAL ANTIBODIES, TRADITIONAL SCREENING AND DIAGNOSIS ALGORITHM: RPR Ser Ql: NONREACTIVE

## 2024-02-23 LAB — HIV ANTIBODY (ROUTINE TESTING W REFLEX): HIV Screen 4th Generation wRfx: NONREACTIVE

## 2024-02-23 MED ORDER — DOXYCYCLINE HYCLATE 100 MG PO CAPS: 100.0000 mg | ORAL_CAPSULE | Freq: Two times a day (BID) | ORAL | 0 refills | Status: AC
